# Patient Record
Sex: Female | Born: 1965 | Race: White | Hispanic: No | Marital: Single | Smoking: Never smoker
Health system: Southern US, Community
[De-identification: ages and names within clinical notes are randomized; demographics above are authoritative.]

## PROBLEM LIST (undated history)

## (undated) DIAGNOSIS — D649 Anemia, unspecified: Secondary | ICD-10-CM

## (undated) DIAGNOSIS — F419 Anxiety disorder, unspecified: Secondary | ICD-10-CM

## (undated) DIAGNOSIS — R5383 Other fatigue: Secondary | ICD-10-CM

## (undated) DIAGNOSIS — N920 Excessive and frequent menstruation with regular cycle: Secondary | ICD-10-CM

## (undated) DIAGNOSIS — T7840XA Allergy, unspecified, initial encounter: Secondary | ICD-10-CM

## (undated) HISTORY — DX: Allergy, unspecified, initial encounter: T78.40XA

## (undated) HISTORY — DX: Anemia, unspecified: D64.9

## (undated) HISTORY — DX: Excessive and frequent menstruation with regular cycle: N92.0

## (undated) HISTORY — DX: Other fatigue: R53.83

## (undated) HISTORY — DX: Anxiety disorder, unspecified: F41.9

---

## 1997-10-11 HISTORY — PX: AUGMENTATION MAMMAPLASTY: SUR837

## 2007-03-28 ENCOUNTER — Emergency Department (HOSPITAL_COMMUNITY): Admission: EM | Admit: 2007-03-28 | Discharge: 2007-03-28 | Payer: Self-pay | Admitting: Emergency Medicine

## 2007-06-05 ENCOUNTER — Encounter: Admission: RE | Admit: 2007-06-05 | Discharge: 2007-06-05 | Payer: Self-pay | Admitting: Orthopedic Surgery

## 2009-01-30 IMAGING — US US MISC SOFT TISSUE
1 series · 12 of 12 positions shown · non-contrast
Comparison: none

CLINICAL DATA: The patient is unable to bend her thumb.  She had a brown recluse spider bite in February 2007, with secondary infection.  She now reports some swelling and tenderness in the distal forearm along the radial aspect. 
 ULTRASOUND OF THE FLEXOR POLLICIS TENDON OF THE RIGHT THUMB:

[Series 1: us misc soft tissue · 0.05mm/px · 12 of 12 slices shown]
[im 1/12]
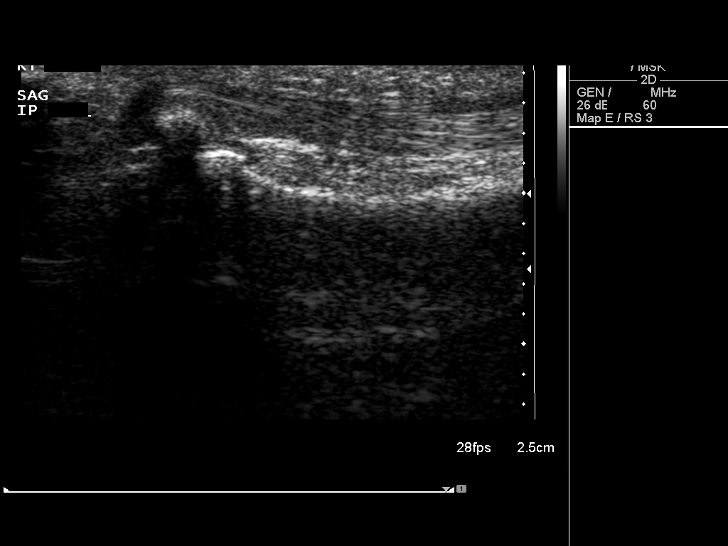
[im 2/12]
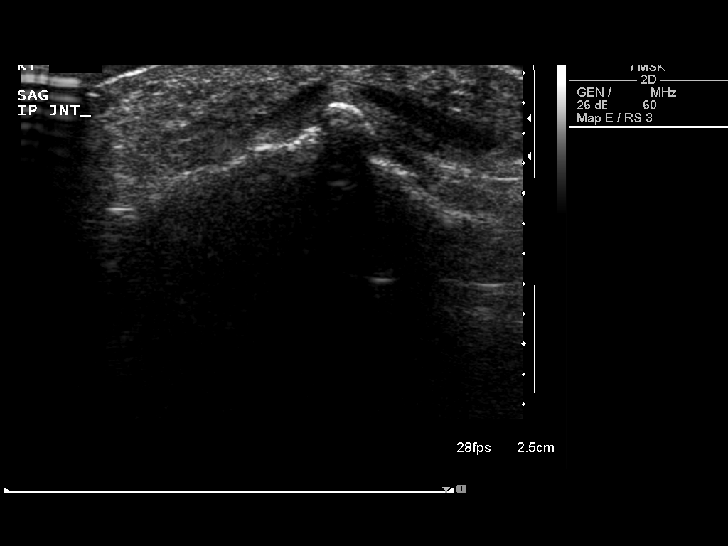
[im 3/12]
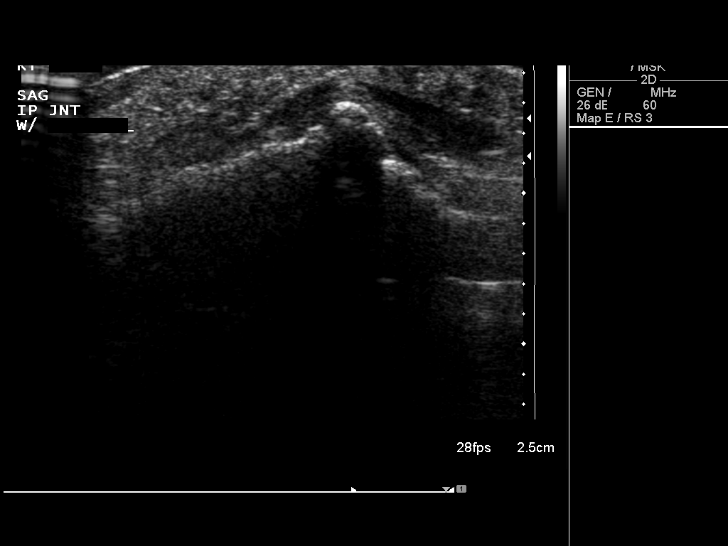
[im 4/12]
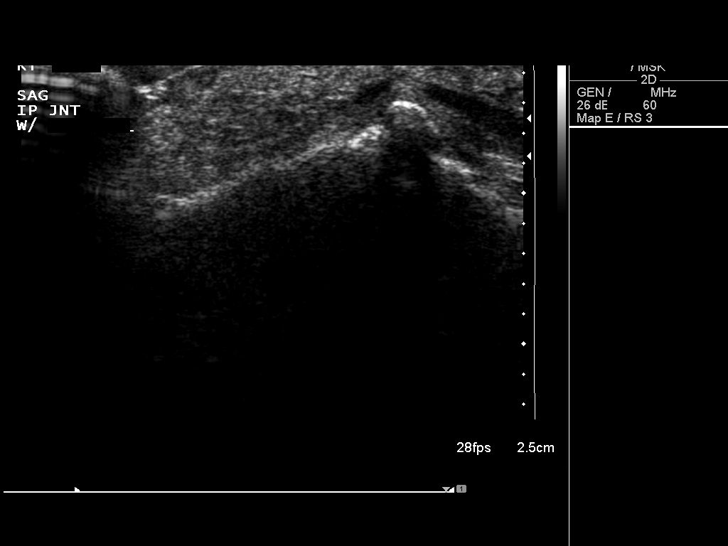
[im 5/12]
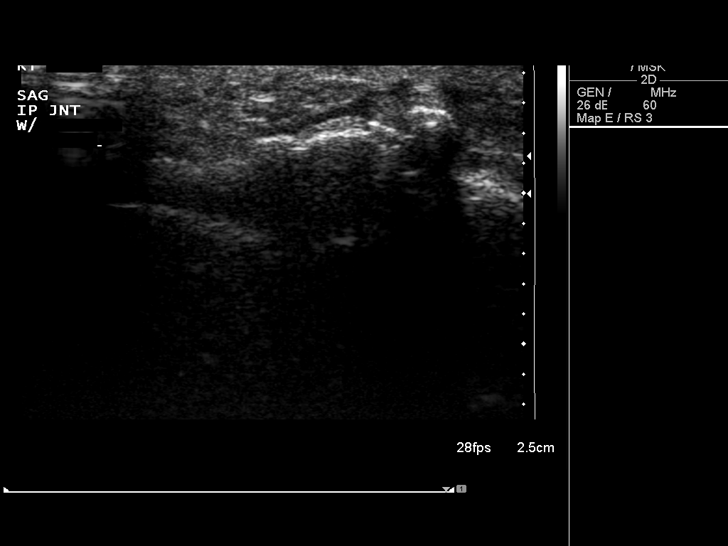
[im 6/12]
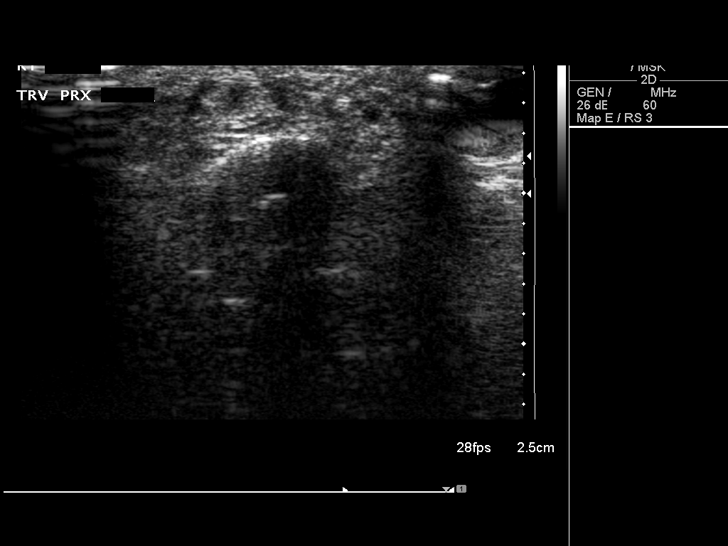
[im 7/12]
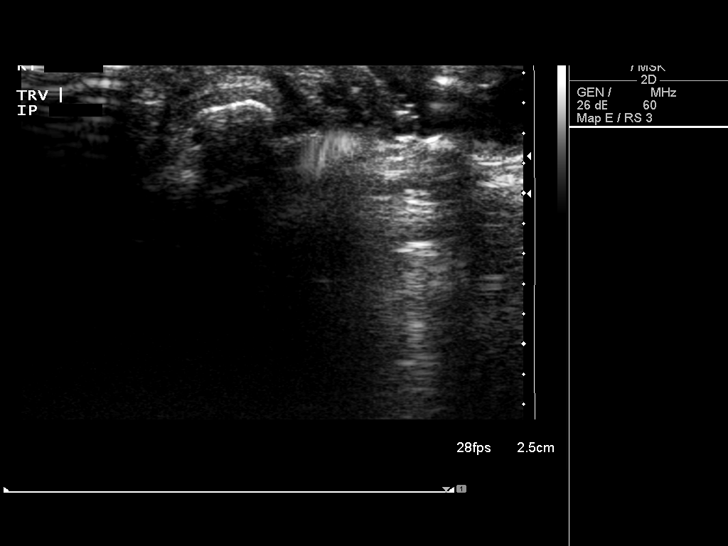
[im 8/12]
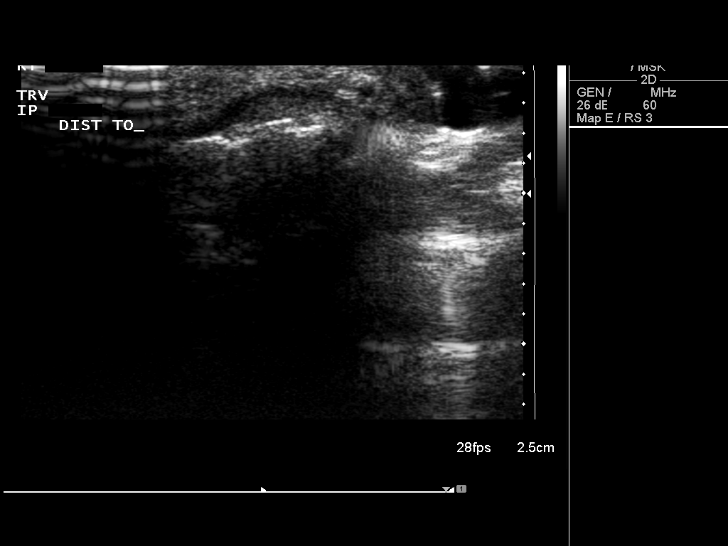
[im 9/12]
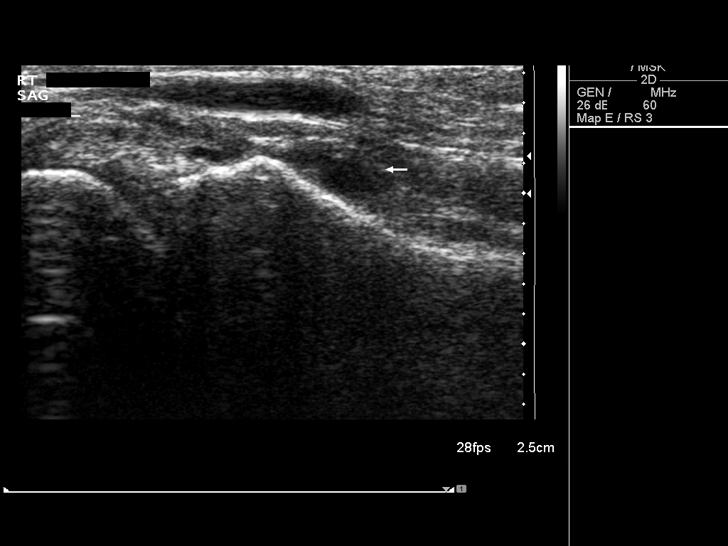
[im 10/12]
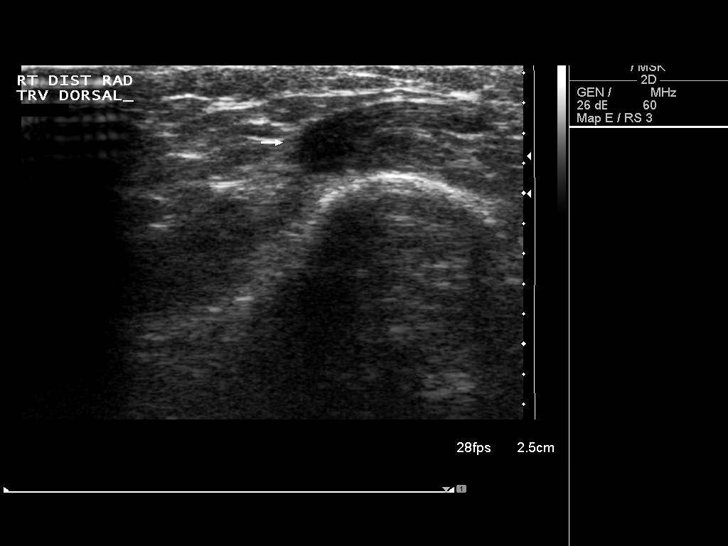
[im 11/12]
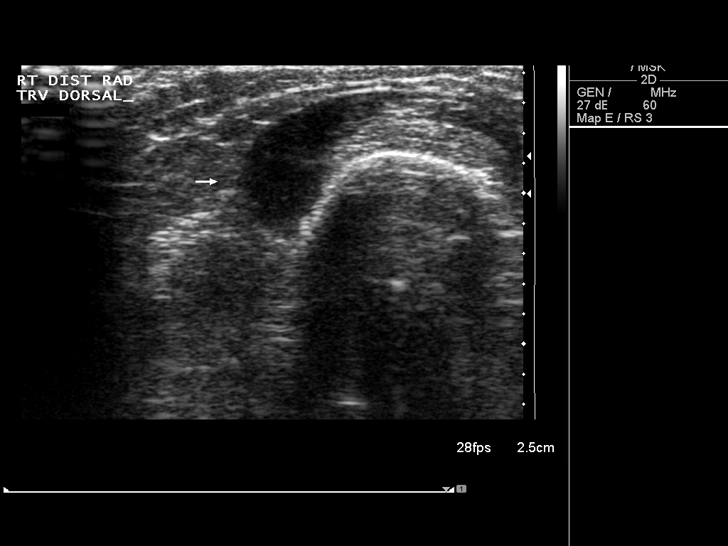
[im 12/12]
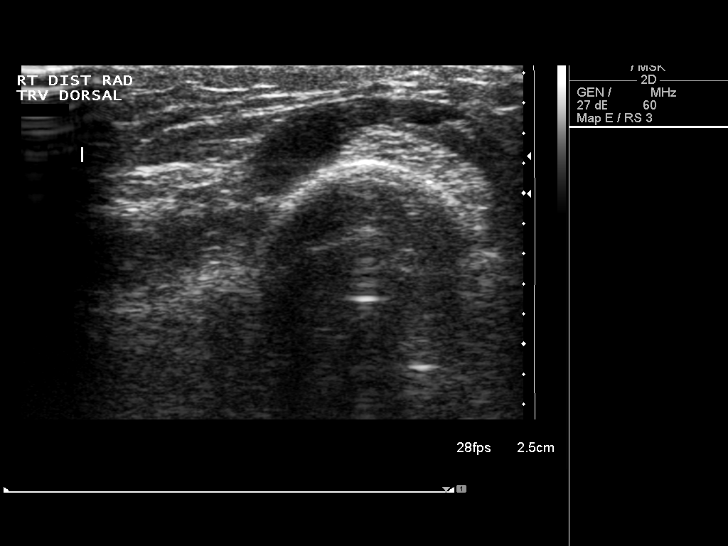

[12 of 12 positions shown; findings below may reference images not displayed]

FINDINGS: The scan demonstrates that the patient has intact flexor pollicis longus tendon, however, there is fluid in the tendon sheath with thickening of the synovium consistent with tenosynovitis.  This extends from the tendon at the level of the distal phalangeal bone extending proximally to approximately the middle proximal phalangeal bone level.  Very limited motion of the tendon with any attempt at flexion or extension.  In comparison with the left thumb, the appearance of the tendon is the same but there is no thickening of the tendon sheath or fluid in the tendon sheath on the left. 
 In addition, I briefly scanned the areas of swelling and tenderness in the distal forearm.  There is a subtle fluid collection along the ventral aspect of the distal radius which may be a ganglion extending from the radiocarpal joint.  The area of swelling dorsally does not demonstrate a focal abnormality but is at the junction of the tendons and muscle of the abductor pollicis longus and brevis.
IMPRESSION: 1.  Tenosynovitis of the flexor pollicis longus tendon primarily at the IP joint and just proximal to it.  However, the tendon itself appears to be intact.
 2.  Small fluid collection along the ventral aspect of the distal radius which may be a ganglion cyst extending from the joint.

## 2010-10-31 ENCOUNTER — Encounter: Payer: Self-pay | Admitting: Family Medicine

## 2012-03-17 ENCOUNTER — Other Ambulatory Visit (HOSPITAL_COMMUNITY): Payer: Self-pay | Admitting: Internal Medicine

## 2012-03-17 DIAGNOSIS — Z139 Encounter for screening, unspecified: Secondary | ICD-10-CM

## 2012-03-23 ENCOUNTER — Ambulatory Visit (HOSPITAL_COMMUNITY): Payer: Self-pay

## 2014-09-18 ENCOUNTER — Encounter: Payer: Self-pay | Admitting: *Deleted

## 2014-09-19 ENCOUNTER — Encounter: Payer: Self-pay | Admitting: Advanced Practice Midwife

## 2015-09-08 ENCOUNTER — Other Ambulatory Visit: Payer: Self-pay

## 2015-09-08 DIAGNOSIS — Z1231 Encounter for screening mammogram for malignant neoplasm of breast: Secondary | ICD-10-CM

## 2015-09-09 ENCOUNTER — Encounter (INDEPENDENT_AMBULATORY_CARE_PROVIDER_SITE_OTHER): Payer: Self-pay | Admitting: *Deleted

## 2016-08-23 ENCOUNTER — Ambulatory Visit (HOSPITAL_COMMUNITY)
Admission: RE | Admit: 2016-08-23 | Discharge: 2016-08-23 | Disposition: A | Discharge status: Home / Self Care | Payer: Self-pay | Source: Ambulatory Visit | Attending: Internal Medicine | Admitting: Internal Medicine

## 2016-08-23 ENCOUNTER — Encounter (HOSPITAL_COMMUNITY): Payer: Self-pay

## 2016-08-23 DIAGNOSIS — Z1231 Encounter for screening mammogram for malignant neoplasm of breast: Secondary | ICD-10-CM

## 2016-12-31 DIAGNOSIS — G4701 Insomnia due to medical condition: Secondary | ICD-10-CM | POA: Diagnosis not present

## 2017-01-06 ENCOUNTER — Other Ambulatory Visit: Payer: Self-pay | Admitting: Family Medicine

## 2017-01-06 DIAGNOSIS — Z1231 Encounter for screening mammogram for malignant neoplasm of breast: Secondary | ICD-10-CM

## 2017-01-28 ENCOUNTER — Ambulatory Visit
Admission: RE | Admit: 2017-01-28 | Discharge: 2017-01-28 | Disposition: A | Discharge status: Home / Self Care | Payer: Commercial Managed Care - HMO | Source: Ambulatory Visit | Attending: Family Medicine | Admitting: Family Medicine

## 2017-01-28 ENCOUNTER — Other Ambulatory Visit: Payer: Self-pay | Admitting: Family Medicine

## 2017-01-28 DIAGNOSIS — Z1231 Encounter for screening mammogram for malignant neoplasm of breast: Secondary | ICD-10-CM

## 2017-03-30 DIAGNOSIS — Z0000 Encounter for general adult medical examination without abnormal findings: Secondary | ICD-10-CM | POA: Diagnosis not present

## 2017-04-01 DIAGNOSIS — R946 Abnormal results of thyroid function studies: Secondary | ICD-10-CM | POA: Diagnosis not present

## 2017-04-01 DIAGNOSIS — G4700 Insomnia, unspecified: Secondary | ICD-10-CM | POA: Diagnosis not present

## 2017-04-03 DIAGNOSIS — Z1211 Encounter for screening for malignant neoplasm of colon: Secondary | ICD-10-CM | POA: Diagnosis not present

## 2017-06-03 DIAGNOSIS — Z6822 Body mass index (BMI) 22.0-22.9, adult: Secondary | ICD-10-CM | POA: Diagnosis not present

## 2017-06-03 DIAGNOSIS — F5101 Primary insomnia: Secondary | ICD-10-CM | POA: Diagnosis not present

## 2017-09-30 DIAGNOSIS — Z6822 Body mass index (BMI) 22.0-22.9, adult: Secondary | ICD-10-CM | POA: Diagnosis not present

## 2017-11-18 DIAGNOSIS — K529 Noninfective gastroenteritis and colitis, unspecified: Secondary | ICD-10-CM | POA: Diagnosis not present

## 2017-11-18 DIAGNOSIS — Z6823 Body mass index (BMI) 23.0-23.9, adult: Secondary | ICD-10-CM | POA: Diagnosis not present

## 2017-12-23 DIAGNOSIS — Z6823 Body mass index (BMI) 23.0-23.9, adult: Secondary | ICD-10-CM | POA: Diagnosis not present

## 2018-06-09 DIAGNOSIS — Z6823 Body mass index (BMI) 23.0-23.9, adult: Secondary | ICD-10-CM | POA: Diagnosis not present

## 2018-10-31 DIAGNOSIS — G4700 Insomnia, unspecified: Secondary | ICD-10-CM | POA: Diagnosis not present

## 2019-02-07 DIAGNOSIS — F5101 Primary insomnia: Secondary | ICD-10-CM | POA: Diagnosis not present

## 2019-03-29 ENCOUNTER — Other Ambulatory Visit: Payer: Self-pay

## 2019-03-29 ENCOUNTER — Telehealth: Payer: Self-pay

## 2019-03-29 ENCOUNTER — Other Ambulatory Visit: Payer: Self-pay | Admitting: Internal Medicine

## 2019-03-29 DIAGNOSIS — Z20822 Contact with and (suspected) exposure to covid-19: Secondary | ICD-10-CM

## 2019-03-29 NOTE — Telephone Encounter (Signed)
Pt. States Dr. Hilma Favors told her to go to [facility] site for community testing. Asking if she needs him to order a test. She does not need order at community test site.

## 2019-04-01 LAB — NOVEL CORONAVIRUS, NAA: SARS-CoV-2, NAA: NOT DETECTED

## 2020-02-05 ENCOUNTER — Other Ambulatory Visit: Payer: Self-pay | Admitting: Internal Medicine

## 2020-02-05 DIAGNOSIS — Z1231 Encounter for screening mammogram for malignant neoplasm of breast: Secondary | ICD-10-CM

## 2021-02-13 ENCOUNTER — Ambulatory Visit: Payer: 59

## 2021-04-08 ENCOUNTER — Other Ambulatory Visit (HOSPITAL_COMMUNITY): Payer: Self-pay | Admitting: Family Medicine

## 2021-04-08 ENCOUNTER — Other Ambulatory Visit: Payer: Self-pay | Admitting: Family Medicine

## 2021-04-08 ENCOUNTER — Ambulatory Visit (HOSPITAL_COMMUNITY)
Admission: RE | Admit: 2021-04-08 | Discharge: 2021-04-08 | Disposition: A | Discharge status: Home / Self Care | Payer: 59 | Source: Ambulatory Visit | Attending: Family Medicine | Admitting: Family Medicine

## 2021-04-08 ENCOUNTER — Other Ambulatory Visit: Payer: Self-pay

## 2021-04-08 ENCOUNTER — Encounter (HOSPITAL_COMMUNITY): Payer: Self-pay

## 2021-04-08 DIAGNOSIS — R1032 Left lower quadrant pain: Secondary | ICD-10-CM | POA: Insufficient documentation

## 2021-04-08 DIAGNOSIS — K625 Hemorrhage of anus and rectum: Secondary | ICD-10-CM | POA: Diagnosis present

## 2021-04-08 MED ORDER — IOHEXOL 300 MG/ML  SOLN
100.0000 mL | Freq: Once | INTRAMUSCULAR | Status: AC | PRN
  Administered 2021-04-08: 100 mL via INTRAVENOUS

## 2021-08-28 ENCOUNTER — Encounter: Payer: Self-pay | Admitting: Internal Medicine

## 2021-10-23 ENCOUNTER — Other Ambulatory Visit: Payer: Self-pay

## 2021-10-23 ENCOUNTER — Encounter: Payer: Self-pay | Admitting: Internal Medicine

## 2021-10-23 ENCOUNTER — Ambulatory Visit (AMBULATORY_SURGERY_CENTER): Payer: Self-pay

## 2021-10-23 VITALS — Ht 63.5 in | Wt 137.0 lb

## 2021-10-23 DIAGNOSIS — Z1211 Encounter for screening for malignant neoplasm of colon: Secondary | ICD-10-CM

## 2021-10-23 MED ORDER — NA SULFATE-K SULFATE-MG SULF 17.5-3.13-1.6 GM/177ML PO SOLN: 1.0000 | Freq: Once | ORAL | 0 refills | Status: AC

## 2021-10-23 NOTE — Progress Notes (Signed)
Denies allergies to eggs or soy products. Denies complication of anesthesia or sedation. Denies use of weight loss medication. Denies use of O2.   Emmi instructions given for colonoscopy.  

## 2021-11-06 ENCOUNTER — Encounter: Payer: 59 | Admitting: Internal Medicine

## 2022-12-04 IMAGING — CT CT ABD-PELV W/ CM
2 of 5 series · 17 of 46 positions shown, 19 images · IV contrast (Omnipaque or Isovue)
Comparison: None.

CLINICAL DATA: Left lower quadrant pain with bright red blood per
rectum

EXAM:
CT ABDOMEN AND PELVIS WITH CONTRAST
TECHNIQUE: Multidetector CT imaging of the abdomen and pelvis was performed
using the standard protocol following bolus administration of
intravenous contrast.
CONTRAST:  100mL OMNIPAQUE IOHEXOL 300 MG/ML  SOLN

[Series 2: axial st · axial · 0.87mm/px · z∈[-1014,-644]mm · 14 of 84 slices shown, 16 images]
[im 5/84  soft-tissue]
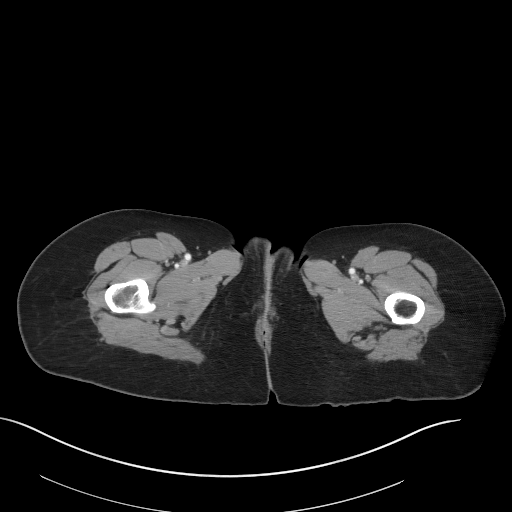
[im 5/84  bone]
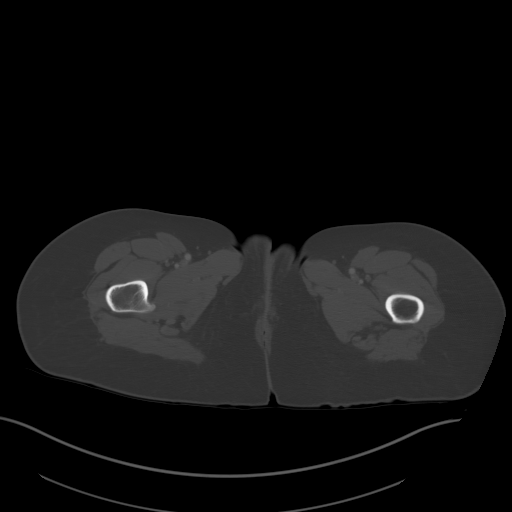
[im 9/84  soft-tissue]
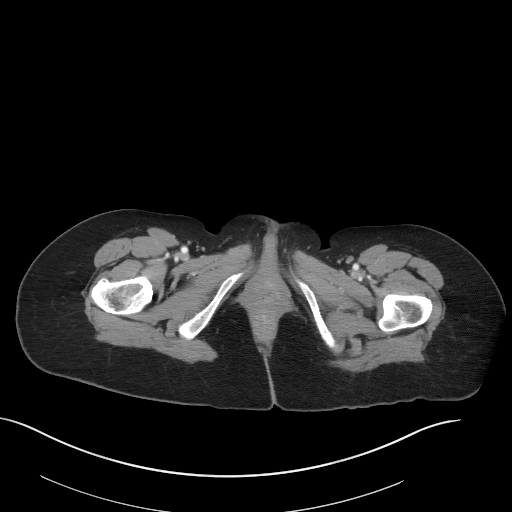
[im 18/84  soft-tissue]
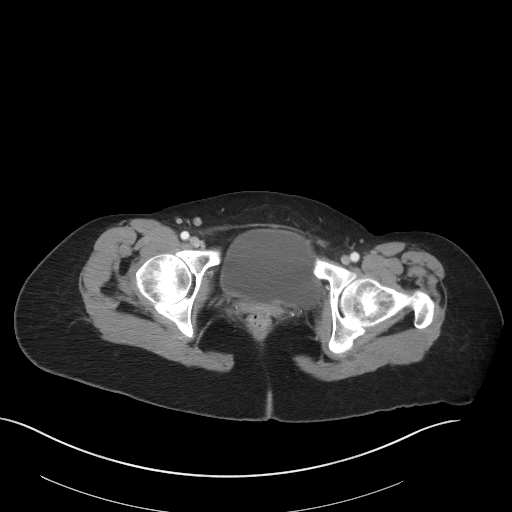
[im 22/84  soft-tissue]
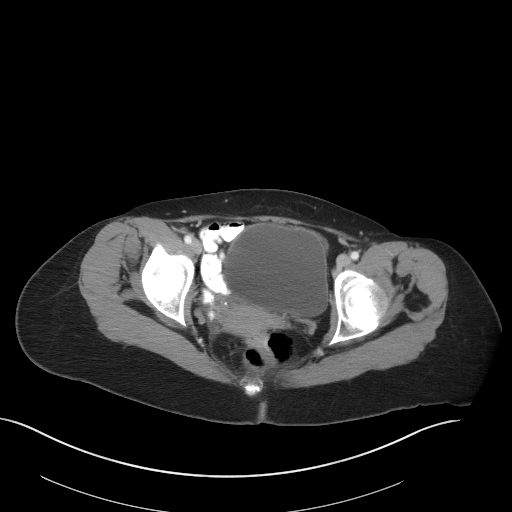
[im 27/84  soft-tissue]
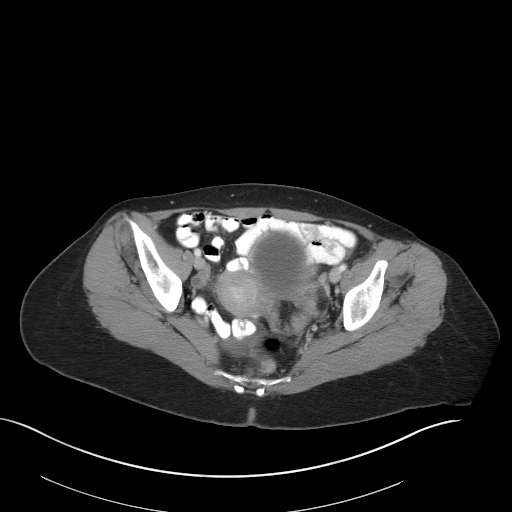
[im 35/84  soft-tissue]
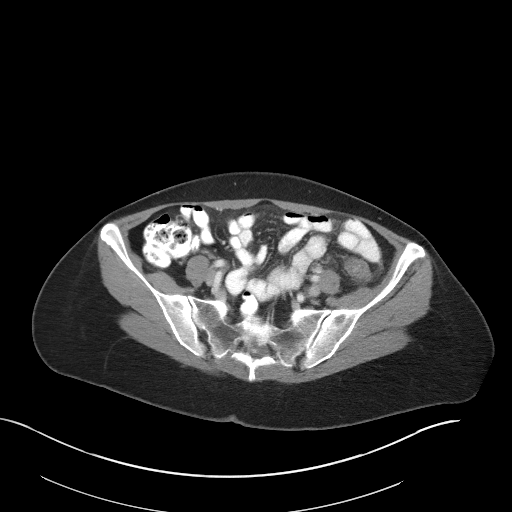
[im 40/84  soft-tissue]
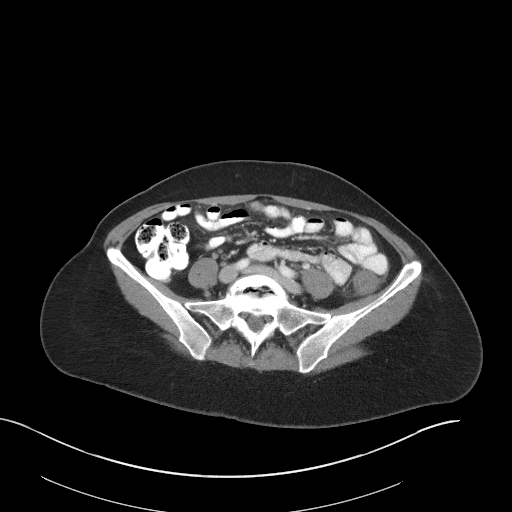
[im 44/84  soft-tissue]
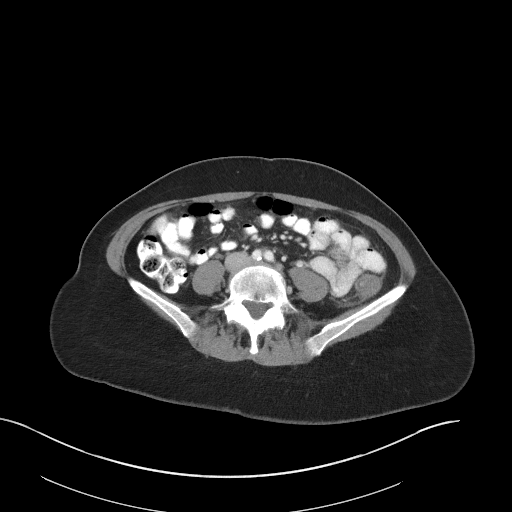
[im 49/84  soft-tissue]
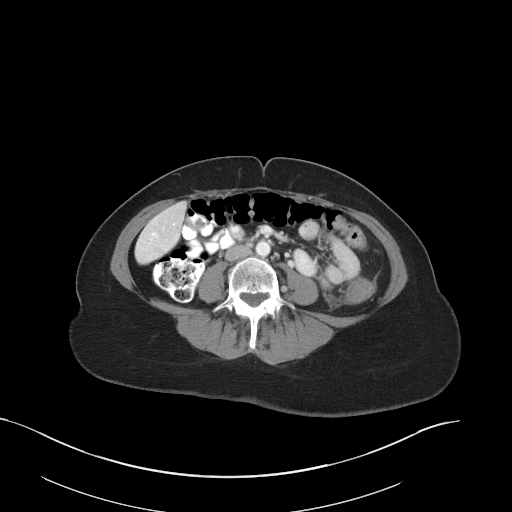
[im 49/84  bone]
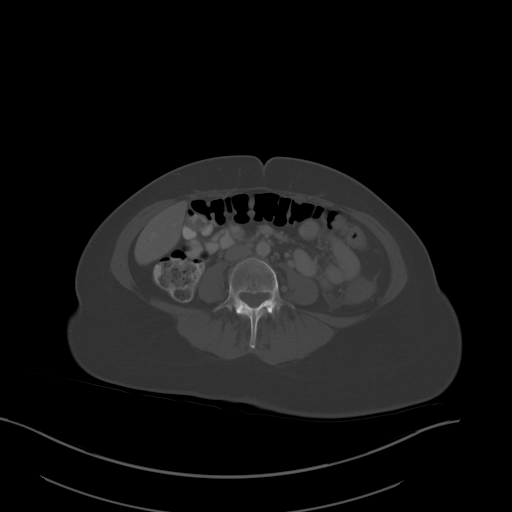
[im 57/84  soft-tissue]
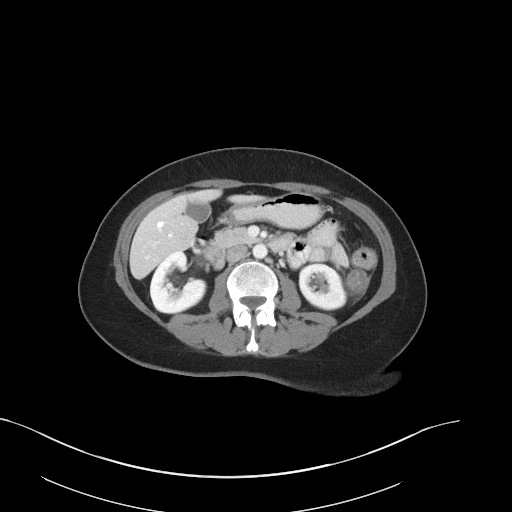
[im 62/84  soft-tissue]
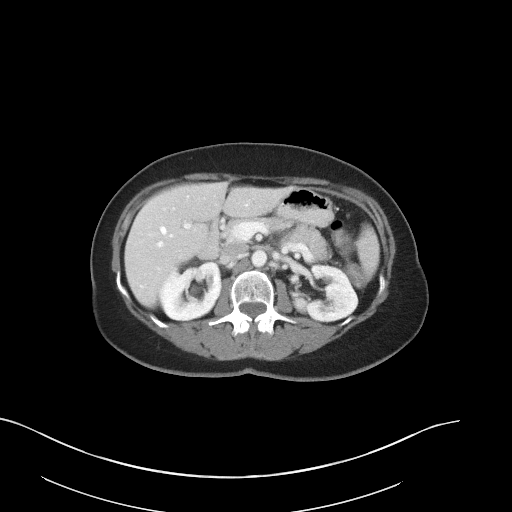
[im 66/84  soft-tissue]
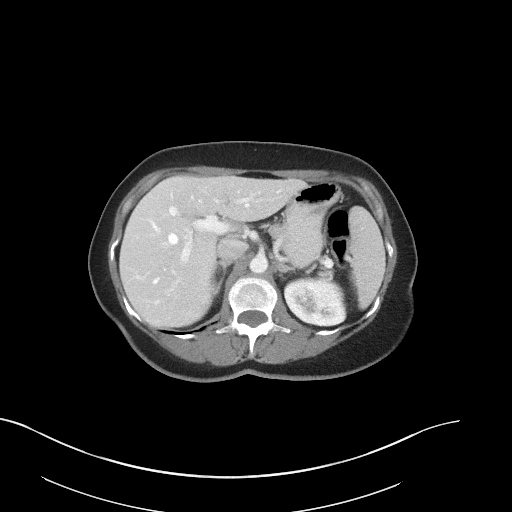
[im 75/84  soft-tissue]
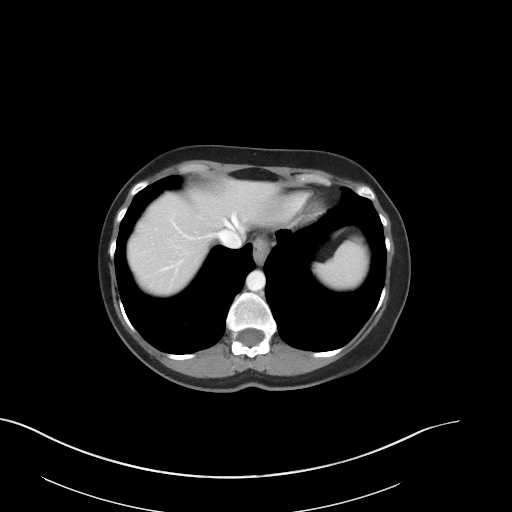
[im 79/84  soft-tissue]
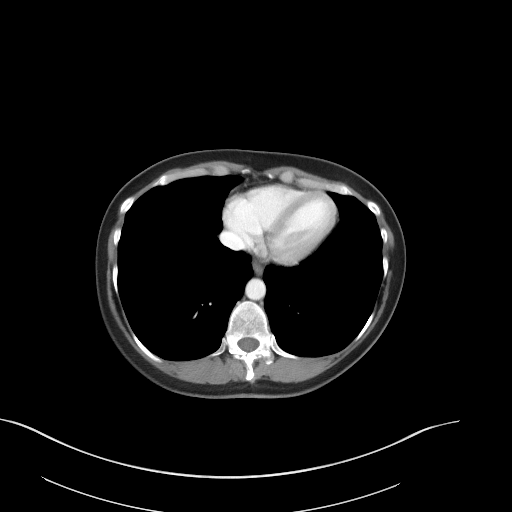

[Series 5: coronal st · coronal · 0.78mm/px · 3 of 96 slices shown]
[im 32/96  soft-tissue]
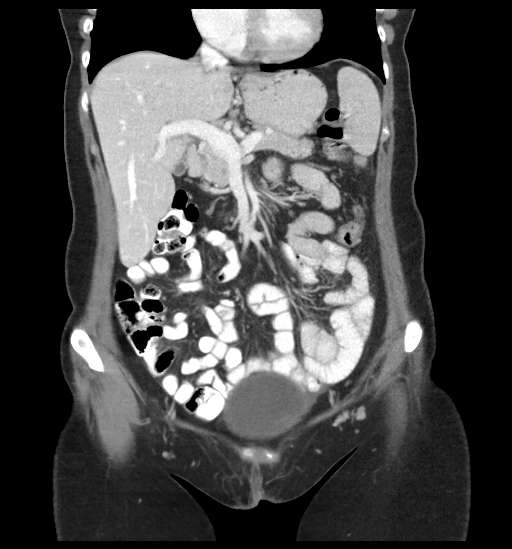
[im 43/96  soft-tissue]
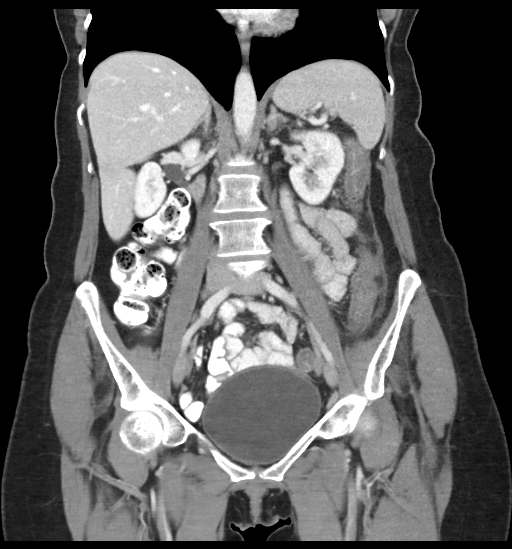
[im 53/96  soft-tissue]
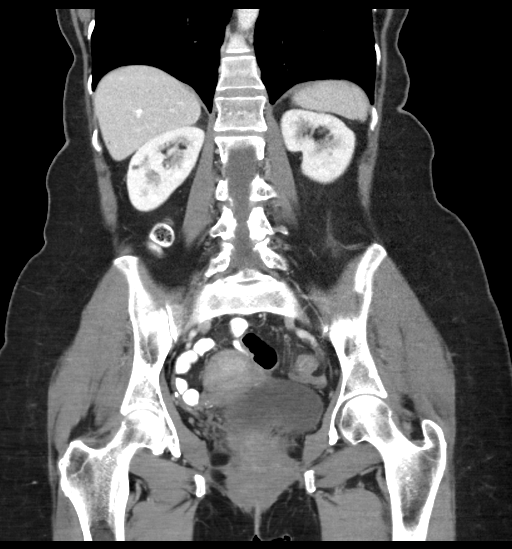

[17 of 46 positions shown; findings below may reference images not displayed]

FINDINGS: Lower chest: No acute abnormality.

Hepatobiliary: No focal liver abnormality is seen. No gallstones,
gallbladder wall thickening, or biliary dilatation.

Pancreas: Unremarkable. No pancreatic ductal dilatation or
surrounding inflammatory changes.

Spleen: Normal in size without focal abnormality.

Adrenals/Urinary Tract: Adrenal glands are within normal limits.
Kidneys demonstrate a normal enhancement pattern bilaterally. Normal
excretion is seen bilaterally as well. No renal calculi or
obstructive changes seen. Bladder is well distended.

Stomach/Bowel: Colon is predominately decompressed. Wall thickening
and mild pericolonic inflammatory changes noted in the descending
colon consistent with focal colitis. Proximal colon and rectum
appear within normal limits. Small bowel and stomach are
unremarkable.

Vascular/Lymphatic: No significant vascular findings are present. No
enlarged abdominal or pelvic lymph nodes.

Reproductive: Uterus and bilateral adnexa are unremarkable.

Other: No abdominal wall hernia or abnormality. No abdominopelvic
ascites.

Musculoskeletal: Mild degenerative changes of lumbar spine are
noted.
IMPRESSION: Diffuse colitis in the descending colon and proximal sigmoid colon.
No abscess is identified.

No other focal abnormality is noted.

## 2023-07-27 ENCOUNTER — Other Ambulatory Visit (HOSPITAL_COMMUNITY): Payer: Self-pay | Admitting: Internal Medicine

## 2023-07-27 DIAGNOSIS — Z1231 Encounter for screening mammogram for malignant neoplasm of breast: Secondary | ICD-10-CM

## 2023-09-01 ENCOUNTER — Ambulatory Visit
Admission: RE | Admit: 2023-09-01 | Discharge: 2023-09-01 | Disposition: A | Discharge status: Home / Self Care | Payer: 59 | Source: Ambulatory Visit | Attending: Internal Medicine | Admitting: Internal Medicine

## 2023-09-01 DIAGNOSIS — Z1231 Encounter for screening mammogram for malignant neoplasm of breast: Secondary | ICD-10-CM

## 2024-10-01 ENCOUNTER — Other Ambulatory Visit: Payer: Self-pay

## 2024-10-01 ENCOUNTER — Encounter (HOSPITAL_COMMUNITY): Payer: Self-pay

## 2024-10-01 ENCOUNTER — Ambulatory Visit (HOSPITAL_COMMUNITY)
Admission: EM | Admit: 2024-10-01 | Discharge: 2024-10-02 | Disposition: A | Discharge status: Home / Self Care | Attending: Emergency Medicine | Admitting: Emergency Medicine

## 2024-10-01 ENCOUNTER — Emergency Department (HOSPITAL_COMMUNITY)

## 2024-10-01 DIAGNOSIS — K3589 Other acute appendicitis without perforation or gangrene: Secondary | ICD-10-CM | POA: Diagnosis not present

## 2024-10-01 DIAGNOSIS — R1031 Right lower quadrant pain: Secondary | ICD-10-CM | POA: Diagnosis present

## 2024-10-01 DIAGNOSIS — I10 Essential (primary) hypertension: Secondary | ICD-10-CM | POA: Insufficient documentation

## 2024-10-01 DIAGNOSIS — F419 Anxiety disorder, unspecified: Secondary | ICD-10-CM | POA: Insufficient documentation

## 2024-10-01 DIAGNOSIS — K3580 Unspecified acute appendicitis: Secondary | ICD-10-CM | POA: Diagnosis not present

## 2024-10-01 DIAGNOSIS — K353 Acute appendicitis with localized peritonitis, without perforation or gangrene: Secondary | ICD-10-CM

## 2024-10-01 LAB — URINALYSIS, ROUTINE W REFLEX MICROSCOPIC
Bilirubin Urine: NEGATIVE
Glucose, UA: NEGATIVE mg/dL
Hgb urine dipstick: NEGATIVE
Ketones, ur: 20 mg/dL — AB
Leukocytes,Ua: NEGATIVE
Nitrite: NEGATIVE
Protein, ur: NEGATIVE mg/dL
Specific Gravity, Urine: 1.021 (ref 1.005–1.030)
pH: 6 (ref 5.0–8.0)

## 2024-10-01 LAB — CBC
HCT: 40.6 % (ref 36.0–46.0)
Hemoglobin: 13.7 g/dL (ref 12.0–15.0)
MCH: 28.1 pg (ref 26.0–34.0)
MCHC: 33.7 g/dL (ref 30.0–36.0)
MCV: 83.4 fL (ref 80.0–100.0)
Platelets: 379 K/uL (ref 150–400)
RBC: 4.87 MIL/uL (ref 3.87–5.11)
RDW: 12.6 % (ref 11.5–15.5)
WBC: 13 K/uL — ABNORMAL HIGH (ref 4.0–10.5)
nRBC: 0 % (ref 0.0–0.2)

## 2024-10-01 LAB — COMPREHENSIVE METABOLIC PANEL WITH GFR
ALT: 9 U/L (ref 0–44)
AST: 18 U/L (ref 15–41)
Albumin: 4.6 g/dL (ref 3.5–5.0)
Alkaline Phosphatase: 78 U/L (ref 38–126)
Anion gap: 15 (ref 5–15)
BUN: 12 mg/dL (ref 6–20)
CO2: 21 mmol/L — ABNORMAL LOW (ref 22–32)
Calcium: 9.3 mg/dL (ref 8.9–10.3)
Chloride: 105 mmol/L (ref 98–111)
Creatinine, Ser: 0.77 mg/dL (ref 0.44–1.00)
GFR, Estimated: >60 mL/min
Glucose, Bld: 114 mg/dL — ABNORMAL HIGH (ref 70–99)
Potassium: 3.6 mmol/L (ref 3.5–5.1)
Sodium: 141 mmol/L (ref 135–145)
Total Bilirubin: 0.7 mg/dL (ref 0.0–1.2)
Total Protein: 7.1 g/dL (ref 6.5–8.1)

## 2024-10-01 LAB — LIPASE, BLOOD: Lipase: 14 U/L (ref 11–51)

## 2024-10-01 MED ORDER — MORPHINE SULFATE (PF) 4 MG/ML IV SOLN
4.0000 mg | Freq: Once | INTRAVENOUS | Status: AC
  Administered 2024-10-01: 4 mg via INTRAVENOUS
  Filled 2024-10-01: qty 1

## 2024-10-01 MED ORDER — ONDANSETRON HCL 4 MG/2ML IJ SOLN
4.0000 mg | Freq: Once | INTRAMUSCULAR | Status: AC
  Administered 2024-10-01: 4 mg via INTRAVENOUS
  Filled 2024-10-01: qty 2

## 2024-10-01 MED ORDER — IOHEXOL 300 MG/ML  SOLN
100.0000 mL | Freq: Once | INTRAMUSCULAR | Status: AC | PRN
  Administered 2024-10-01: 100 mL via INTRAVENOUS

## 2024-10-01 NOTE — ED Provider Notes (Signed)
 " [facility] AT [facility] Provider Note   CSN: [id number] Arrival date & time: 10/01/24  2047     Patient presents with: Abdominal Pain   [facility] is a 58 y.o. female presents today for right lower quadrant abdominal pain with nausea, vomiting, and diarrhea.  Patient reports that the pain is still at times she nearly passes out.  Patient also reports fever for which she is taking Tylenol  Motrin for.  Patient denies chest pain, shortness of breath, cough, congestion, urinary symptoms, or vaginal discharge.    Abdominal Pain Associated symptoms: diarrhea, nausea and vomiting        Prior to Admission medications  Medication Sig Start Date End Date Taking? Authorizing Provider  amphetamine-dextroamphetamine (ADDERALL) 20 MG tablet Take 20 mg by mouth daily.    [provider]  LORazepam (ATIVAN) 1 MG tablet Take 1 mg by mouth every 6 (six) hours as needed for anxiety.    [provider]  zolpidem (AMBIEN CR) 12.5 MG CR tablet Take 12.5 mg by mouth at bedtime.    [provider]    Allergies: Aspirin and Penicillins    Review of Systems  Gastrointestinal:  Positive for abdominal pain, diarrhea, nausea and vomiting.    Updated Vital Signs BP (!) 149/91   Pulse 87   Temp 98.5 F (36.9 C) (Oral)   Resp 17   Wt 65.3 kg   LMP 01/14/2017   SpO2 96%   BMI 25.11 kg/m   Physical Exam Vitals and nursing note reviewed.  Constitutional:      General: She is not in acute distress.    Appearance: She is well-developed.     Comments: Uncomfortable appearing  HENT:     Head: Normocephalic and atraumatic.  Eyes:     Conjunctiva/sclera: Conjunctivae normal.  Cardiovascular:     Rate and Rhythm: Normal rate and regular rhythm.     Heart sounds: No murmur heard. Pulmonary:     Effort: Pulmonary effort is normal. No respiratory distress.     Breath sounds: Normal breath sounds.  Abdominal:     Palpations: Abdomen is  soft.     Tenderness: There is abdominal tenderness in the right lower quadrant. There is guarding. Positive signs include McBurney's sign.  Musculoskeletal:        General: No swelling.     Cervical back: Neck supple.  Skin:    General: Skin is warm and dry.     Capillary Refill: Capillary refill takes less than 2 seconds.  Neurological:     Mental Status: She is alert.  Psychiatric:        Mood and Affect: Mood normal.     (all labs ordered are listed, but only abnormal results are displayed) Labs Reviewed  URINALYSIS, ROUTINE W REFLEX MICROSCOPIC - Abnormal; Notable for the following components:      Result Value   Ketones, ur 20 (*)    All other components within normal limits  CBC - Abnormal; Notable for the following components:   WBC 13.0 (*)    All other components within normal limits  COMPREHENSIVE METABOLIC PANEL WITH GFR - Abnormal; Notable for the following components:   CO2 21 (*)    Glucose, Bld 114 (*)    All other components within normal limits  LIPASE, BLOOD    EKG: None  Radiology: No results found.   Procedures   Medications Ordered in the ED  morphine  (PF) 4 MG/ML injection 4  mg (4 mg Intravenous Given 10/01/24 2240)  ondansetron  (ZOFRAN ) injection 4 mg (4 mg Intravenous Given 10/01/24 2240)  iohexol  (OMNIPAQUE ) 300 MG/ML solution 100 mL (100 mLs Intravenous Contrast Given 10/01/24 2356)                                    Medical Decision Making Amount and/or Complexity of Data Reviewed Labs: ordered. Radiology: ordered.  Risk Prescription drug management.   This patient presents to the ED for concern of abdominal pain with nausea, vomiting, diarrhea differential diagnosis includes appendicitis, pancreatitis, choledocholithiasis, acute cholecystitis, kidney stone, UTI, SBO, diverticulitis, viral GI illness   Lab Tests:  I Ordered, and personally interpreted labs.  The pertinent results include: Leukocytosis of 13, CMP with mildly  reduced CO2 at 21, lipase 14, UA with 20 ketones   Imaging Studies ordered:  I ordered imaging studies including CT abdomen pelvis with contrast I independently visualized and interpreted imaging which showed Pending I agree with the radiologist interpretation   Medicines ordered and prescription drug management:  I ordered medication including morphine  and Zofran     I have reviewed the patients home medicines and have made adjustments as needed  Patient signed out to Tanya Gula, MD pending CT which will determine patient disposition.      Final diagnoses:  None    ED Discharge Orders     None          [facility] 10/02/24 0001    Tanya Lamar BROCKS, MD 10/07/24 2034  "

## 2024-10-01 NOTE — ED Triage Notes (Signed)
 Pt reports RLQ pain with N/V/D.  Pt reports the pain is so bad it makes her feel like she is going to pass out.

## 2024-10-02 ENCOUNTER — Encounter (HOSPITAL_COMMUNITY)
Admission: EM | Disposition: A | Discharge status: Home / Self Care | Payer: Self-pay | Source: Home / Self Care | Attending: Emergency Medicine

## 2024-10-02 ENCOUNTER — Emergency Department (HOSPITAL_COMMUNITY): Admitting: Certified Registered"

## 2024-10-02 ENCOUNTER — Other Ambulatory Visit: Payer: Self-pay

## 2024-10-02 ENCOUNTER — Encounter (HOSPITAL_COMMUNITY): Payer: Self-pay | Admitting: Emergency Medicine

## 2024-10-02 DIAGNOSIS — K3580 Unspecified acute appendicitis: Secondary | ICD-10-CM | POA: Diagnosis not present

## 2024-10-02 DIAGNOSIS — K353 Acute appendicitis with localized peritonitis, without perforation or gangrene: Secondary | ICD-10-CM | POA: Diagnosis not present

## 2024-10-02 HISTORY — PX: XI ROBOTIC LAPAROSCOPIC ASSISTED APPENDECTOMY: SHX6877

## 2024-10-02 SURGERY — APPENDECTOMY, ROBOT-ASSISTED, LAPAROSCOPIC
Anesthesia: General | Site: Abdomen

## 2024-10-02 MED ORDER — FENTANYL CITRATE (PF) 50 MCG/ML IJ SOSY
25.0000 ug | PREFILLED_SYRINGE | INTRAMUSCULAR | Status: DC | PRN
  Administered 2024-10-02 (×3): 50 ug via INTRAVENOUS

## 2024-10-02 MED ORDER — OXYCODONE HCL 5 MG/5ML PO SOLN: 5.0000 mg | Freq: Once | ORAL | Status: AC | PRN

## 2024-10-02 MED ORDER — PROPOFOL 500 MG/50ML IV EMUL
INTRAVENOUS | Status: DC | PRN
  Administered 2024-10-02: 130 mg via INTRAVENOUS
  Administered 2024-10-02: 25 ug/kg/min via INTRAVENOUS

## 2024-10-02 MED ORDER — FENTANYL CITRATE (PF) 100 MCG/2ML IJ SOLN
INTRAMUSCULAR | Status: DC | PRN
  Administered 2024-10-02: 100 ug via INTRAVENOUS
  Administered 2024-10-02: 50 ug via INTRAVENOUS

## 2024-10-02 MED ORDER — BUPIVACAINE HCL (PF) 0.5 % IJ SOLN
INTRAMUSCULAR | Status: AC
  Filled 2024-10-02: qty 30

## 2024-10-02 MED ORDER — CHLORHEXIDINE GLUCONATE CLOTH 2 % EX PADS: 6.0000 | MEDICATED_PAD | Freq: Once | CUTANEOUS | Status: DC

## 2024-10-02 MED ORDER — HEMOSTATIC AGENTS (NO CHARGE) OPTIME
TOPICAL | Status: DC | PRN
  Administered 2024-10-02: 1 via TOPICAL

## 2024-10-02 MED ORDER — FENTANYL CITRATE (PF) 100 MCG/2ML IJ SOLN
INTRAMUSCULAR | Status: AC
  Filled 2024-10-02: qty 2

## 2024-10-02 MED ORDER — LIDOCAINE 2% (20 MG/ML) 5 ML SYRINGE
INTRAMUSCULAR | Status: AC
  Filled 2024-10-02: qty 5

## 2024-10-02 MED ORDER — ONDANSETRON HCL 4 MG/2ML IJ SOLN: 4.0000 mg | Freq: Once | INTRAMUSCULAR | Status: DC | PRN

## 2024-10-02 MED ORDER — PROPOFOL 500 MG/50ML IV EMUL
INTRAVENOUS | Status: AC
  Filled 2024-10-02: qty 50

## 2024-10-02 MED ORDER — CHLORHEXIDINE GLUCONATE 0.12 % MT SOLN
15.0000 mL | Freq: Once | OROMUCOSAL | Status: DC
  Filled 2024-10-02: qty 15

## 2024-10-02 MED ORDER — DEXAMETHASONE SOD PHOSPHATE PF 10 MG/ML IJ SOLN
INTRAMUSCULAR | Status: DC | PRN
  Administered 2024-10-02: 8 mg via INTRAVENOUS

## 2024-10-02 MED ORDER — MORPHINE SULFATE (PF) 4 MG/ML IV SOLN
4.0000 mg | INTRAVENOUS | Status: DC | PRN
  Administered 2024-10-02: 4 mg via INTRAVENOUS
  Filled 2024-10-02: qty 1

## 2024-10-02 MED ORDER — OXYCODONE HCL 5 MG/5ML PO SOLN: 5.0000 mg | Freq: Once | ORAL | Status: DC | PRN

## 2024-10-02 MED ORDER — SODIUM CHLORIDE 0.9 % IV SOLN
2.0000 g | Freq: Once | INTRAVENOUS | Status: AC
  Administered 2024-10-02: 2 g via INTRAVENOUS
  Filled 2024-10-02: qty 20

## 2024-10-02 MED ORDER — SODIUM CHLORIDE 0.9 % IV SOLN: INTRAVENOUS | Status: DC

## 2024-10-02 MED ORDER — OXYCODONE HCL 5 MG PO TABS
5.0000 mg | ORAL_TABLET | Freq: Once | ORAL | Status: AC | PRN
  Administered 2024-10-02: 5 mg via ORAL
  Filled 2024-10-02: qty 1

## 2024-10-02 MED ORDER — SUGAMMADEX SODIUM 200 MG/2ML IV SOLN
INTRAVENOUS | Status: AC
  Filled 2024-10-02: qty 2

## 2024-10-02 MED ORDER — ONDANSETRON HCL 4 MG/2ML IJ SOLN
INTRAMUSCULAR | Status: DC | PRN
  Administered 2024-10-02: 4 mg via INTRAVENOUS

## 2024-10-02 MED ORDER — KETOROLAC TROMETHAMINE 30 MG/ML IJ SOLN
INTRAMUSCULAR | Status: AC
  Filled 2024-10-02: qty 1

## 2024-10-02 MED ORDER — MIDAZOLAM HCL 2 MG/2ML IJ SOLN
INTRAMUSCULAR | Status: AC
  Filled 2024-10-02: qty 2

## 2024-10-02 MED ORDER — SODIUM CHLORIDE 0.9 % IV SOLN
2.0000 g | INTRAVENOUS | Status: AC
  Administered 2024-10-02: 2 g via INTRAVENOUS
  Filled 2024-10-02 (×2): qty 2

## 2024-10-02 MED ORDER — OXYCODONE HCL 5 MG PO TABS: 5.0000 mg | ORAL_TABLET | Freq: Once | ORAL | Status: DC | PRN

## 2024-10-02 MED ORDER — LACTATED RINGERS IV SOLN: INTRAVENOUS | Status: DC

## 2024-10-02 MED ORDER — MIDAZOLAM HCL (PF) 2 MG/2ML IJ SOLN
INTRAMUSCULAR | Status: DC | PRN
  Administered 2024-10-02: 2 mg via INTRAVENOUS

## 2024-10-02 MED ORDER — FENTANYL CITRATE (PF) 50 MCG/ML IJ SOSY
25.0000 ug | PREFILLED_SYRINGE | INTRAMUSCULAR | Status: DC | PRN
  Administered 2024-10-02: 50 ug via INTRAVENOUS
  Filled 2024-10-02 (×4): qty 1

## 2024-10-02 MED ORDER — KETOROLAC TROMETHAMINE 30 MG/ML IJ SOLN
INTRAMUSCULAR | Status: DC | PRN
  Administered 2024-10-02: 30 mg via INTRAVENOUS

## 2024-10-02 MED ORDER — LIDOCAINE 2% (20 MG/ML) 5 ML SYRINGE
INTRAMUSCULAR | Status: DC | PRN
  Administered 2024-10-02: 60 mg via INTRAVENOUS

## 2024-10-02 MED ORDER — SUGAMMADEX SODIUM 200 MG/2ML IV SOLN
INTRAVENOUS | Status: DC | PRN
  Administered 2024-10-02: 260 mg via INTRAVENOUS

## 2024-10-02 MED ORDER — DEXMEDETOMIDINE HCL IN NACL 80 MCG/20ML IV SOLN
INTRAVENOUS | Status: DC | PRN
  Administered 2024-10-02: 8 ug via INTRAVENOUS

## 2024-10-02 MED ORDER — BUPIVACAINE HCL (PF) 0.5 % IJ SOLN
INTRAMUSCULAR | Status: DC | PRN
  Administered 2024-10-02: 20 mL

## 2024-10-02 MED ORDER — STERILE WATER FOR IRRIGATION IR SOLN
Status: DC | PRN
  Administered 2024-10-02: 500 mL

## 2024-10-02 MED ORDER — ACETAMINOPHEN 10 MG/ML IV SOLN
INTRAVENOUS | Status: DC | PRN
  Administered 2024-10-02: 1000 mg via INTRAVENOUS

## 2024-10-02 MED ORDER — OXYCODONE HCL 5 MG PO TABS: 5.0000 mg | ORAL_TABLET | ORAL | 0 refills | Status: AC | PRN

## 2024-10-02 MED ORDER — ONDANSETRON HCL 4 MG/2ML IJ SOLN
INTRAMUSCULAR | Status: AC
  Filled 2024-10-02: qty 2

## 2024-10-02 MED ORDER — ROCURONIUM BROMIDE 10 MG/ML (PF) SYRINGE
PREFILLED_SYRINGE | INTRAVENOUS | Status: AC
  Filled 2024-10-02: qty 10

## 2024-10-02 MED ORDER — ROCURONIUM BROMIDE 10 MG/ML (PF) SYRINGE
PREFILLED_SYRINGE | INTRAVENOUS | Status: DC | PRN
  Administered 2024-10-02: 50 mg via INTRAVENOUS

## 2024-10-02 MED ORDER — ORAL CARE MOUTH RINSE: 15.0000 mL | Freq: Once | OROMUCOSAL | Status: DC

## 2024-10-02 MED ORDER — ACETAMINOPHEN 10 MG/ML IV SOLN
INTRAVENOUS | Status: AC
  Filled 2024-10-02: qty 100

## 2024-10-02 MED ORDER — METRONIDAZOLE 500 MG/100ML IV SOLN
500.0000 mg | Freq: Once | INTRAVENOUS | Status: AC
  Administered 2024-10-02: 500 mg via INTRAVENOUS
  Filled 2024-10-02: qty 100

## 2024-10-02 SURGICAL SUPPLY — 38 items
CANNULA REDUCER 12-8 DVNC XI (CANNULA) ×1 IMPLANT
CHLORAPREP W/TINT 26 (MISCELLANEOUS) ×1 IMPLANT
COVER LIGHT HANDLE (MISCELLANEOUS) IMPLANT
COVER MAYO STAND XLG (MISCELLANEOUS) ×1 IMPLANT
DERMABOND ADVANCED .7 DNX12 (GAUZE/BANDAGES/DRESSINGS) ×1 IMPLANT
DRAPE ARM DVNC X/XI (DISPOSABLE) ×3 IMPLANT
DRAPE COLUMN DVNC XI (DISPOSABLE) ×1 IMPLANT
ELECTRODE REM PT RTRN 9FT ADLT (ELECTROSURGICAL) ×1 IMPLANT
FORCEPS BPLR R/ABLATION 8 DVNC (INSTRUMENTS) ×1 IMPLANT
GAUZE 4X4 16PLY ~~LOC~~+RFID DBL (SPONGE) ×1 IMPLANT
GLOVE BIOGEL PI IND STRL 7.0 (GLOVE) ×2 IMPLANT
GLOVE SURG SS PI 7.5 STRL IVOR (GLOVE) ×2 IMPLANT
GOWN STRL REUS W/TWL LRG LVL3 (GOWN DISPOSABLE) ×3 IMPLANT
IRRIGATOR SUCT 8 DISP DVNC XI (IRRIGATION / IRRIGATOR) IMPLANT
KIT PINK PAD W/HEAD ARM REST (MISCELLANEOUS) ×1 IMPLANT
KIT TURNOVER KIT A (KITS) ×1 IMPLANT
MANIFOLD NEPTUNE II (INSTRUMENTS) ×1 IMPLANT
NDL HYPO 21X1.5 SAFETY (NEEDLE) ×1 IMPLANT
NDL INSUFFLATION 14GA 120MM (NEEDLE) ×1 IMPLANT
NEEDLE HYPO 21X1.5 SAFETY (NEEDLE) ×1 IMPLANT
NEEDLE INSUFFLATION 14GA 120MM (NEEDLE) ×1 IMPLANT
NS IRRIG 500ML POUR BTL (IV SOLUTION) ×1 IMPLANT
OBTURATOR OPTICALSTD 8 DVNC (TROCAR) ×1 IMPLANT
PACK LAP CHOLE LZT030E (CUSTOM PROCEDURE TRAY) ×1 IMPLANT
PAD ARMBOARD POSITIONER FOAM (MISCELLANEOUS) ×1 IMPLANT
PENCIL HANDSWITCHING (ELECTRODE) ×1 IMPLANT
POSITIONER HEAD 8X9X4 ADT (SOFTGOODS) ×1 IMPLANT
POWDER SURGICEL 3.0 GRAM (HEMOSTASIS) IMPLANT
RELOAD STAPLE 30 2.5 WHT DVNC (STAPLE) ×1 IMPLANT
SEAL UNIV 5-12 XI (MISCELLANEOUS) ×3 IMPLANT
SET TUBE SMOKE EVAC HIGH FLOW (TUBING) IMPLANT
STAPLER 30 CRVD 8 SUREFORM (STAPLE) ×1 IMPLANT
SUT MNCRL AB 4-0 PS2 18 (SUTURE) ×1 IMPLANT
SYR 30ML LL (SYRINGE) ×1 IMPLANT
SYSTEM RETRIEVL 5MM INZII UNIV (BASKET) ×1 IMPLANT
TAPE TRANSPORE STRL 2 31045 (GAUZE/BANDAGES/DRESSINGS) ×1 IMPLANT
TIP ENDOSCOPIC SURGICEL (TIP) IMPLANT
WATER STERILE IRR 500ML POUR (IV SOLUTION) ×1 IMPLANT

## 2024-10-02 NOTE — Op Note (Signed)
 Patient:  Tanya [facility]  DOB:  01-30-1966  MRN:  [id number]   Preop Diagnosis:  Acute appendicitis   Postop Diagnosis:  Same   Procedure:  Robotic assisted laparoscopic appendectomy   Surgeon:  Oneil Budge, MD   Anes:  GET  Indications: Patient is a 58 year old white female who presents to the emergency room with right lower quadrant abdominal pain.  CT scan of the abdomen revealed acute appendicitis.  The patient presents for robotic assisted laparoscopic appendectomy.  The risks and benefits of the procedure including bleeding, infection, and the possibility of an open procedure were fully explained to the patient, who gave informed consent.  Procedure note: The patient was placed in the supine position.  After induction of general endotracheal anesthesia, the abdomen was prepped and draped using the usual sterile technique with ChloraPrep.  Surgical site confirmation was performed.  An incision was made in the left upper quadrant at Palmer's point.  A Veress needle was introduced into the abdominal cavity and confirmation of placement was done using the saline drop test.  The abdomen was then insufflated to 15 mmHg pressure.  An 8 mm trocar was introduced into the abdominal cavity under direct visualization without difficulty.  Additional 8 mm trocars were placed in the left flank and left lower quadrant regions.  The robot was then docked and targeted.  The appendix was visualized and noted to be acutely inflamed.  The mesoappendix was divided using the vessel sealer.  Once the base of the appendix was identified, a white load sure fire Endo GIA was placed and fired.  The appendix was then removed using an Endo Catch bag without difficulty.  The staple lines were inspected and noted to be within normal limits.  Surgicel powder was placed in the right lower quadrant over the staple line.  The robot was undocked and all air was evacuated from the abdominal cavity prior to the removal of the  trocars.  All wounds were irrigated with normal saline.  All wounds were injected with 0.5% Sensorcaine .  All incisions were closed using a 4-0 Monocryl subcuticular suture.  Dermabond was applied.  All tape and needle counts were correct at the end of the procedure.  The patient was extubated in the operating room and transferred to PACU in stable condition.  Complications: None  EBL: Minimal  Specimen: Appendix

## 2024-10-02 NOTE — H&P (Signed)
 Tanya [facility] is an 58 y.o. female.   Chief Complaint: Right lower quadrant abdominal pain HPI: Patient is a 58 year old white female who presented to the emergency room with worsening lower abdominal pain.  CT scan of the abdomen reveals acute appendicitis without perforation.  Patient states this has been going on for over 48 hours.  Past Medical History:  Diagnosis Date   Allergy    Anemia    Anxiety    Fatigue    Menorrhagia     Past Surgical History:  Procedure Laterality Date   AUGMENTATION MAMMAPLASTY Bilateral 1999   implants    Family History  Problem Relation Age of Onset   Breast cancer Maternal Aunt    Colon cancer Neg Hx    Esophageal cancer Neg Hx    Stomach cancer Neg Hx    Rectal cancer Neg Hx    Social History:  reports that she has never smoked. She does not have any smokeless tobacco history on file. She reports that she does not currently use alcohol. She reports that she does not use drugs.  Allergies: Allergies[1]  (Not in a [facility] admission)   Results for orders placed or performed during the [facility] encounter of 10/01/24 (from the past 48 hours)  CBC     Status: Abnormal   Collection Time: 10/01/24  9:36 PM  Result Value Ref Range   WBC 13.0 (H) 4.0 - 10.5 K/uL   RBC 4.87 3.87 - 5.11 MIL/uL   Hemoglobin 13.7 12.0 - 15.0 g/dL   HCT 59.3 63.9 - 53.9 %   MCV 83.4 80.0 - 100.0 fL   MCH 28.1 26.0 - 34.0 pg   MCHC 33.7 30.0 - 36.0 g/dL   RDW 87.3 88.4 - 84.4 %   Platelets 379 150 - 400 K/uL   nRBC 0.0 0.0 - 0.2 %    Comment: Performed at [facility], [address], [place], [place] [address]  Comprehensive metabolic panel with GFR     Status: Abnormal   Collection Time: 10/01/24  9:36 PM  Result Value Ref Range   Sodium 141 135 - 145 mmol/L   Potassium 3.6 3.5 - 5.1 mmol/L   Chloride 105 98 - 111 mmol/L   CO2 21 (L) 22 - 32 mmol/L   Glucose, Bld 114 (H) 70 - 99 mg/dL    Comment: Glucose reference range applies only to samples taken after  fasting for at least 8 hours.   BUN 12 6 - 20 mg/dL   Creatinine, Ser 9.22 0.44 - 1.00 mg/dL   Calcium 9.3 8.9 - 89.6 mg/dL   Total Protein 7.1 6.5 - 8.1 g/dL   Albumin 4.6 3.5 - 5.0 g/dL   AST 18 15 - 41 U/L   ALT 9 0 - 44 U/L   Alkaline Phosphatase 78 38 - 126 U/L   Total Bilirubin 0.7 0.0 - 1.2 mg/dL   GFR, Estimated >39 >39 mL/min    Comment: (NOTE) Calculated using the CKD-EPI Creatinine Equation (2021)    Anion gap 15 5 - 15    Comment: Performed at [facility], [address], [place], [place] [address]  Lipase, blood     Status: None   Collection Time: 10/01/24  9:36 PM  Result Value Ref Range   Lipase 14 11 - 51 U/L    Comment: Performed at [facility], [address] [place], [place] [address]  Urinalysis, Routine w reflex microscopic -Urine, Clean Catch     Status: Abnormal  Collection Time: 10/01/24 11:19 PM  Result Value Ref Range   Color, Urine YELLOW YELLOW   APPearance CLEAR CLEAR   Specific Gravity, Urine 1.021 1.005 - 1.030   pH 6.0 5.0 - 8.0   Glucose, UA NEGATIVE NEGATIVE mg/dL   Hgb urine dipstick NEGATIVE NEGATIVE   Bilirubin Urine NEGATIVE NEGATIVE   Ketones, ur 20 (A) NEGATIVE mg/dL   Protein, ur NEGATIVE NEGATIVE mg/dL   Nitrite NEGATIVE NEGATIVE   Leukocytes,Ua NEGATIVE NEGATIVE    Comment: Performed at [facility], [address] [place], [place] [address]   CT ABDOMEN PELVIS W CONTRAST Result Date: 10/02/2024 CLINICAL DATA:  Right lower quadrant pain EXAM: CT ABDOMEN AND PELVIS WITH CONTRAST TECHNIQUE: Multidetector CT imaging of the abdomen and pelvis was performed using the standard protocol following bolus administration of intravenous contrast. RADIATION DOSE REDUCTION: This exam was performed according to the departmental dose-optimization program which includes automated exposure control, adjustment of the mA and/or kV according to patient size and/or use of iterative reconstruction technique. CONTRAST:  OMNIPAQUE  IOHEXOL  300 MG/ML   SOLN COMPARISON:  CT 04/08/2021 FINDINGS: Lower chest: Lung bases are clear. Partially visualized bilateral breast implants. Hepatobiliary: No focal liver abnormality is seen. No gallstones, gallbladder wall thickening, or biliary dilatation. Pancreas: Unremarkable. No pancreatic ductal dilatation or surrounding inflammatory changes. Spleen: Normal in size without focal abnormality. Adrenals/Urinary Tract: Adrenal glands are unremarkable. Kidneys are normal, without renal calculi, focal lesion, or hydronephrosis. Bladder is unremarkable. Stomach/Bowel: Stomach within normal limits. No dilated small bowel. Abnormal appendix, measures 7 mm maximum but considerable periappendiceal stranding. No extraluminal gas. Mild wall thickening of the base of cecum. Appendix: Location: Right lower quadrant Diameter: 7 mm Appendicolith: Negative Mucosal hyperenhancement: Positive Extraluminal gas: Negative Periappendical collection: Negative Vascular/Lymphatic: No significant vascular findings are present. No enlarged abdominal or pelvic lymph nodes. Reproductive: Uterus and bilateral adnexa are unremarkable. Other: No free air.  No ascites Musculoskeletal: No acute osseous abnormality. IMPRESSION: Findings consistent with acute appendicitis. No evidence for perforation or abscess. Electronically Signed   By: Luke Bun M.D.   On: 10/02/2024 00:11    Review of Systems  Constitutional:  Positive for fatigue.  HENT: Negative.    Eyes: Negative.   Respiratory: Negative.    Cardiovascular: Negative.   Gastrointestinal:  Positive for abdominal pain.  Endocrine: Negative.   Genitourinary: Negative.   Musculoskeletal: Negative.   Allergic/Immunologic: Negative.   Neurological: Negative.   Hematological: Negative.   Psychiatric/Behavioral: Negative.      Blood pressure (!) 148/81, pulse 77, temperature 98.6 F (37 C), temperature source Oral, resp. rate 16, weight 65.3 kg, last menstrual period 01/14/2017, SpO2  94%. Physical Exam Vitals reviewed.  Constitutional:      Appearance: She is well-developed. She is not ill-appearing.  HENT:     Head: Normocephalic and atraumatic.  Cardiovascular:     Rate and Rhythm: Normal rate and regular rhythm.     Heart sounds: No murmur heard.    No friction rub. No gallop.  Pulmonary:     Effort: Pulmonary effort is normal. No respiratory distress.     Breath sounds: Normal breath sounds. No stridor. No wheezing, rhonchi or rales.  Abdominal:     General: Abdomen is flat. There is no distension.     Palpations: Abdomen is soft.     Tenderness: There is abdominal tenderness in the right lower quadrant. There is no rebound. Positive signs include McBurney's sign.     Hernia: No hernia is  present.  Skin:    General: Skin is warm and dry.  Neurological:     Mental Status: She is alert and oriented to person, place, and time.     CT scan images personally reviewed Assessment/Plan Impression: Acute appendicitis Plan: Patient will be taken to the operating room today for a robotic assisted laparoscopic appendectomy.  The risks and benefits of the procedure including bleeding, infection, and the possibility of an open procedure were fully explained to the patient, who gave informed consent.  Oneil Budge, MD 10/02/2024, 7:01 AM       [1]  Allergies Allergen Reactions   Aspirin     Stomach upset.   Penicillins Rash

## 2024-10-02 NOTE — Anesthesia Procedure Notes (Signed)
 Procedure Name: Intubation Date/Time: 10/02/2024 10:55 AM  Performed by: Para Jerelene CROME, CRNAPre-anesthesia Checklist: Patient identified, Emergency Drugs available, Suction available and Patient being monitored Patient Re-evaluated:Patient Re-evaluated prior to induction Oxygen Delivery Method: Circle system utilized Preoxygenation: Pre-oxygenation with 100% oxygen Induction Type: IV induction Ventilation: Mask ventilation without difficulty Laryngoscope Size: Mac and 3 Grade View: Grade II Tube type: Oral Tube size: 7.0 mm Number of attempts: 1 Airway Equipment and Method: Stylet Placement Confirmation: positive ETCO2, CO2 detector, breath sounds checked- equal and bilateral and ETT inserted through vocal cords under direct vision Secured at: 21 (OETT secured 21 cm at lower lip.) cm Tube secured with: Tape Dental Injury: Teeth and Oropharynx as per pre-operative assessment  Comments: Atraumatic intubation x 1. Lips and teeth remain in preoperative condition.

## 2024-10-02 NOTE — Transfer of Care (Signed)
 Immediate Anesthesia Transfer of Care Note  Patient: [facility]  Procedure(s) Performed: APPENDECTOMY, ROBOT-ASSISTED, LAPAROSCOPIC (Abdomen)  Patient Location: PACU  Anesthesia Type:General  Level of Consciousness: drowsy and patient cooperative  Airway & Oxygen Therapy: Patient Spontanous Breathing and Patient connected to face mask oxygen  Post-op Assessment: Report given to RN and Post -op Vital signs reviewed and stable  Post vital signs: Reviewed and stable  Last Vitals:  Vitals Value Taken Time  BP 146/87 10/02/24 11:52  Temp 97.6 10/02/24   11:52  Pulse 67 10/02/24 11:56  Resp 16 10/02/24 11:56  SpO2 100 % 10/02/24 11:56  Vitals shown include unfiled device data.  Last Pain:  Vitals:   10/02/24 0907  TempSrc: Oral  PainSc: 8       Patients Stated Pain Goal: 6 (10/02/24 0907)  Complications: No notable events documented.

## 2024-10-02 NOTE — Interval H&P Note (Signed)
 History and Physical Interval Note:  10/02/2024 10:27 AM  T[facility]  has presented today for surgery, with the diagnosis of acute appendicitis.  The various methods of treatment have been discussed with the patient and family. After consideration of risks, benefits and other options for treatment, the patient has consented to  Procedures: APPENDECTOMY, ROBOT-ASSISTED, LAPAROSCOPIC (N/A) as a surgical intervention.  The patient's history has been reviewed, patient examined, no change in status, stable for surgery.  I have reviewed the patient's chart and labs.  Questions were answered to the patient's satisfaction.     Oneil Budge

## 2024-10-02 NOTE — Anesthesia Preprocedure Evaluation (Signed)
"                                    Anesthesia Evaluation  Patient identified by MRN, date of birth, ID band Patient awake    Reviewed: Allergy & Precautions, H&P , NPO status , Patient's Chart, lab work & pertinent test results, reviewed documented beta blocker date and time   Airway Mallampati: II  TM Distance: >3 FB Neck ROM: full    Dental no notable dental hx.    Pulmonary neg pulmonary ROS   Pulmonary exam normal breath sounds clear to auscultation       Cardiovascular Exercise Tolerance: Good hypertension, negative cardio ROS  Rhythm:regular Rate:Normal     Neuro/Psych   Anxiety     negative neurological ROS  negative psych ROS   GI/Hepatic negative GI ROS, Neg liver ROS,,,  Endo/Other  negative endocrine ROS    Renal/GU negative Renal ROS  negative genitourinary   Musculoskeletal   Abdominal   Peds  Hematology  (+) Blood dyscrasia, anemia   Anesthesia Other Findings   Reproductive/Obstetrics negative OB ROS                              Anesthesia Physical Anesthesia Plan  ASA: 2  Anesthesia Plan: General and General ETT   Post-op Pain Management:    Induction:   PONV Risk Score and Plan: Ondansetron   Airway Management Planned:   Additional Equipment:   Intra-op Plan:   Post-operative Plan:   Informed Consent: I have reviewed the patients History and Physical, chart, labs and discussed the procedure including the risks, benefits and alternatives for the proposed anesthesia with the patient or authorized representative who has indicated his/her understanding and acceptance.     Dental Advisory Given  Plan Discussed with: CRNA  Anesthesia Plan Comments:         Anesthesia Quick Evaluation  "

## 2024-10-02 NOTE — ED Triage Notes (Addendum)
 SABRA

## 2024-10-02 NOTE — ED Notes (Signed)
 Pt dressed out and placed in gown

## 2024-10-02 NOTE — ED Provider Notes (Signed)
 Care assumed at shift change pending CT to rule out appendicitis.  Physical Exam  BP (!) 173/96   Pulse 92   Temp 98.6 F (37 C) (Oral)   Resp 16   Wt 65.3 kg   LMP 01/14/2017   SpO2 95%   BMI 25.11 kg/m   Physical Exam  Procedures  Procedures  ED Course / MDM   Clinical Course as of 10/02/24 0316  Tue Oct 02, 2024  0030 I personally viewed the images from radiology studies and agree with radiologist interpretation: CT shows uncomplicated appendicitis. Will begin Abx, discuss with Gen Surg. Analgesia as needed.  [CS]  [phone] Spoke with Dr. Mavis, Surgery, who will evaluate in AM for surgery.  [CS]    Clinical Course User Index [CS] Roselyn Carlin NOVAK, MD   Medical Decision Making Problems Addressed: Acute appendicitis with localized peritonitis, without perforation, abscess, or gangrene: acute illness or injury  Amount and/or Complexity of Data Reviewed Radiology: independent interpretation performed.  Risk Prescription drug management. Parenteral controlled substances. Decision regarding hospitalization.          Roselyn Carlin NOVAK, MD 10/02/24 [phone]

## 2024-10-03 ENCOUNTER — Encounter (HOSPITAL_COMMUNITY): Payer: Self-pay | Admitting: General Surgery

## 2024-10-03 LAB — SURGICAL PATHOLOGY

## 2024-10-05 NOTE — Anesthesia Postprocedure Evaluation (Signed)
"   Anesthesia Post Note  Patient: Tanya Bennett  Procedure(s) Performed: APPENDECTOMY, ROBOT-ASSISTED, LAPAROSCOPIC (Abdomen)  Patient location during evaluation: Phase II Anesthesia Type: General Level of consciousness: awake Pain management: pain level controlled Vital Signs Assessment: post-procedure vital signs reviewed and stable Respiratory status: spontaneous breathing and respiratory function stable Cardiovascular status: blood pressure returned to baseline and stable Postop Assessment: no headache and no apparent nausea or vomiting Anesthetic complications: no Comments: Late entry   No notable events documented.   Last Vitals:  Vitals:   10/02/24 1323 10/02/24 1330  BP:  (!) (P) 147/82  Pulse: 81 (P) 73  Resp: 14 (P) 16  Temp:  (P) 36.4 C  SpO2: 99% (P) 98%    Last Pain:  Vitals:   10/02/24 1330  TempSrc:   PainSc: (P) 4                  Yvonna PARAS Santosha Jividen      "

## 2024-10-12 ENCOUNTER — Telehealth: Payer: Self-pay | Admitting: General Surgery

## 2024-10-12 DIAGNOSIS — Z09 Encounter for follow-up examination after completed treatment for conditions other than malignant neoplasm: Secondary | ICD-10-CM

## 2024-10-12 NOTE — Telephone Encounter (Signed)
 Unable to contact patient.  Left message on mobile number to call my office with any concerns.
